# Patient Record
Sex: Male | Born: 1940 | Race: White | Hispanic: No | Smoking: Former smoker
Health system: Southern US, Community
[De-identification: ages and names within clinical notes are randomized; demographics above are authoritative.]

## PROBLEM LIST (undated history)

## (undated) DIAGNOSIS — G4730 Sleep apnea, unspecified: Secondary | ICD-10-CM

## (undated) DIAGNOSIS — K219 Gastro-esophageal reflux disease without esophagitis: Secondary | ICD-10-CM

## (undated) DIAGNOSIS — G5603 Carpal tunnel syndrome, bilateral upper limbs: Secondary | ICD-10-CM

## (undated) DIAGNOSIS — I209 Angina pectoris, unspecified: Secondary | ICD-10-CM

## (undated) DIAGNOSIS — T4145XA Adverse effect of unspecified anesthetic, initial encounter: Secondary | ICD-10-CM

## (undated) DIAGNOSIS — J302 Other seasonal allergic rhinitis: Secondary | ICD-10-CM

## (undated) DIAGNOSIS — T8859XA Other complications of anesthesia, initial encounter: Secondary | ICD-10-CM

## (undated) DIAGNOSIS — D649 Anemia, unspecified: Secondary | ICD-10-CM

## (undated) DIAGNOSIS — R569 Unspecified convulsions: Secondary | ICD-10-CM

## (undated) DIAGNOSIS — R011 Cardiac murmur, unspecified: Secondary | ICD-10-CM

## (undated) DIAGNOSIS — M1990 Unspecified osteoarthritis, unspecified site: Secondary | ICD-10-CM

## (undated) DIAGNOSIS — I2510 Atherosclerotic heart disease of native coronary artery without angina pectoris: Secondary | ICD-10-CM

## (undated) DIAGNOSIS — G629 Polyneuropathy, unspecified: Secondary | ICD-10-CM

## (undated) DIAGNOSIS — I10 Essential (primary) hypertension: Secondary | ICD-10-CM

## (undated) DIAGNOSIS — R3915 Urgency of urination: Secondary | ICD-10-CM

## (undated) HISTORY — PX: CARDIAC CATHETERIZATION: SHX172

## (undated) HISTORY — PX: JOINT REPLACEMENT: SHX530

## (undated) HISTORY — PX: BACK SURGERY: SHX140

## (undated) HISTORY — PX: TONSILLECTOMY: SUR1361

## (undated) HISTORY — PX: APPENDECTOMY: SHX54

---

## 2005-01-02 ENCOUNTER — Inpatient Hospital Stay (HOSPITAL_COMMUNITY): Admission: RE | Admit: 2005-01-02 | Discharge: 2005-01-06 | Payer: Self-pay | Admitting: Neurological Surgery

## 2005-01-28 ENCOUNTER — Encounter: Admission: RE | Admit: 2005-01-28 | Discharge: 2005-01-28 | Payer: Self-pay | Admitting: Neurological Surgery

## 2012-06-17 ENCOUNTER — Other Ambulatory Visit: Payer: Self-pay | Admitting: Neurological Surgery

## 2012-06-17 DIAGNOSIS — M48061 Spinal stenosis, lumbar region without neurogenic claudication: Secondary | ICD-10-CM

## 2012-06-17 DIAGNOSIS — M545 Low back pain: Secondary | ICD-10-CM

## 2012-06-19 ENCOUNTER — Ambulatory Visit
Admission: RE | Admit: 2012-06-19 | Discharge: 2012-06-19 | Disposition: A | Discharge status: Home / Self Care | Payer: Medicare Other | Source: Ambulatory Visit | Attending: Neurological Surgery | Admitting: Neurological Surgery

## 2012-06-19 DIAGNOSIS — M48061 Spinal stenosis, lumbar region without neurogenic claudication: Secondary | ICD-10-CM

## 2012-06-19 DIAGNOSIS — M545 Low back pain: Secondary | ICD-10-CM

## 2012-07-20 ENCOUNTER — Other Ambulatory Visit: Payer: Self-pay | Admitting: Neurological Surgery

## 2012-07-21 ENCOUNTER — Other Ambulatory Visit (HOSPITAL_COMMUNITY): Payer: Self-pay | Admitting: Neurological Surgery

## 2012-07-21 ENCOUNTER — Other Ambulatory Visit: Payer: Self-pay | Admitting: Neurological Surgery

## 2012-07-21 ENCOUNTER — Telehealth (HOSPITAL_COMMUNITY): Payer: Self-pay

## 2012-07-21 DIAGNOSIS — M4802 Spinal stenosis, cervical region: Secondary | ICD-10-CM

## 2012-07-21 NOTE — Telephone Encounter (Signed)
I spoke with Mr. Broner and advised him of his apt.

## 2012-07-23 ENCOUNTER — Ambulatory Visit (HOSPITAL_COMMUNITY)
Admission: RE | Admit: 2012-07-23 | Discharge: 2012-07-23 | Disposition: A | Discharge status: Home / Self Care | Payer: Medicare Other | Source: Ambulatory Visit | Attending: Neurological Surgery | Admitting: Neurological Surgery

## 2012-07-23 ENCOUNTER — Encounter (HOSPITAL_COMMUNITY): Payer: Self-pay | Admitting: Pharmacist

## 2012-07-23 DIAGNOSIS — M4802 Spinal stenosis, cervical region: Secondary | ICD-10-CM

## 2012-07-29 ENCOUNTER — Encounter (HOSPITAL_COMMUNITY)
Admission: RE | Admit: 2012-07-29 | Discharge: 2012-07-29 | Disposition: A | Discharge status: Home / Self Care | Payer: Medicare Other | Source: Ambulatory Visit | Attending: Neurological Surgery | Admitting: Neurological Surgery

## 2012-07-29 ENCOUNTER — Encounter (HOSPITAL_COMMUNITY): Payer: Self-pay

## 2012-07-29 HISTORY — DX: Unspecified convulsions: R56.9

## 2012-07-29 HISTORY — DX: Other seasonal allergic rhinitis: J30.2

## 2012-07-29 HISTORY — DX: Adverse effect of unspecified anesthetic, initial encounter: T41.45XA

## 2012-07-29 HISTORY — DX: Angina pectoris, unspecified: I20.9

## 2012-07-29 HISTORY — DX: Essential (primary) hypertension: I10

## 2012-07-29 HISTORY — DX: Anemia, unspecified: D64.9

## 2012-07-29 HISTORY — DX: Urgency of urination: R39.15

## 2012-07-29 HISTORY — DX: Other complications of anesthesia, initial encounter: T88.59XA

## 2012-07-29 HISTORY — DX: Gastro-esophageal reflux disease without esophagitis: K21.9

## 2012-07-29 HISTORY — DX: Cardiac murmur, unspecified: R01.1

## 2012-07-29 HISTORY — DX: Carpal tunnel syndrome, bilateral upper limbs: G56.03

## 2012-07-29 HISTORY — DX: Unspecified osteoarthritis, unspecified site: M19.90

## 2012-07-29 HISTORY — DX: Atherosclerotic heart disease of native coronary artery without angina pectoris: I25.10

## 2012-07-29 HISTORY — DX: Polyneuropathy, unspecified: G62.9

## 2012-07-29 HISTORY — DX: Sleep apnea, unspecified: G47.30

## 2012-07-29 LAB — PROTIME-INR
INR: 1.08 (ref 0.00–1.49)
Prothrombin Time: 13.9 seconds (ref 11.6–15.2)

## 2012-07-29 LAB — BASIC METABOLIC PANEL
Chloride: 99 mEq/L (ref 96–112)
Creatinine, Ser: 0.64 mg/dL (ref 0.50–1.35)
GFR calc Af Amer: >90 mL/min (ref >90)
GFR calc non Af Amer: >90 mL/min (ref >90)
Potassium: 4.2 mEq/L (ref 3.5–5.1)

## 2012-07-29 LAB — CBC WITH DIFFERENTIAL/PLATELET
Basophils Absolute: 0 10*3/uL (ref 0.0–0.1)
Basophils Relative: 1 % (ref 0–1)
HCT: 37.7 % — ABNORMAL LOW (ref 39.0–52.0)
Lymphocytes Relative: 31 % (ref 12–46)
MCHC: 34 g/dL (ref 30.0–36.0)
Monocytes Absolute: 0.9 10*3/uL (ref 0.1–1.0)
Neutro Abs: 3 10*3/uL (ref 1.7–7.7)
Neutrophils Relative %: 48 % (ref 43–77)
RDW: 13.3 % (ref 11.5–15.5)
WBC: 6.3 10*3/uL (ref 4.0–10.5)

## 2012-07-29 LAB — TYPE AND SCREEN
ABO/RH(D): A POS
Antibody Screen: NEGATIVE

## 2012-07-29 LAB — ABO/RH: ABO/RH(D): A POS

## 2012-07-29 NOTE — Pre-Procedure Instructions (Signed)
20 Wayne Moore  07/29/2012   Your procedure is scheduled on:  08/05/12  Report to [facility] at 10:30 AM.  Call this number if you have problems the morning of surgery: [phone]   Remember:    Do not eat food or drink any liquids:After Midnight.   Take these medicines the morning of surgery with A SIP OF WATER: Gabapentin/Neurontin, Metoprolol/Lopressor, Prilosec/Omeprazole   Do not wear jewelry, make-up or nail polish.  Do not wear lotions, powders, or perfumes. You may wear deodorant.  Do not shave 48 hours prior to surgery. Men may shave face and neck.  Do not bring valuables to the hospital.  Contacts, dentures or bridgework may not be worn into surgery.  Leave suitcase in the car. After surgery it may be brought to your room.  For patients admitted to the hospital, checkout time is 11:00 AM the day of discharge.   Patients discharged the day of surgery will not be allowed to drive home.    Special Instructions: CHG Shower Use Special Wash: 1/2 bottle night before surgery and 1/2 bottle morning of surgery.   Please read over the following fact sheets that you were given: Pain Booklet, Coughing and Deep Breathing, Blood Transfusion Information, MRSA Information and Surgical Site Infection Prevention

## 2012-07-29 NOTE — Progress Notes (Addendum)
Wayne Moore to request Sleep Study done 2 yrs ago from [facility] sleep Center and stress test done 2 yrs ago at [facility]. States this Cardiologist released him to primary MD Wayne Moore at [facility]- seen last week.  Also req EKG from primary MD -done last week and CXR done 03/2012 at [facility]  Patient uses Nasal Cannula only with CPAP machine.    Per Wayne Moore at [facility] Records- Sleep Study was done 2006 and those records are not stored on site.

## 2012-07-30 NOTE — Progress Notes (Signed)
Called and requested stress test from [facility]

## 2012-07-30 NOTE — Progress Notes (Signed)
Requested stress,ekg,office notes from [facility].

## 2012-08-03 NOTE — Progress Notes (Signed)
Call placed again to [facility] at [facility] and re-requested stress test.

## 2012-08-04 ENCOUNTER — Other Ambulatory Visit (HOSPITAL_COMMUNITY): Payer: Self-pay | Admitting: Neurological Surgery

## 2012-08-04 ENCOUNTER — Ambulatory Visit (HOSPITAL_COMMUNITY)
Admission: RE | Admit: 2012-08-04 | Discharge: 2012-08-04 | Disposition: A | Discharge status: Home / Self Care | Payer: Medicare Other | Source: Ambulatory Visit | Attending: Neurological Surgery | Admitting: Neurological Surgery

## 2012-08-04 ENCOUNTER — Inpatient Hospital Stay (HOSPITAL_COMMUNITY)
Admission: RE | Admit: 2012-08-04 | Discharge: 2012-08-08 | DRG: 460 | Disposition: A | Discharge status: Home / Self Care | Payer: Medicare Other | Source: Ambulatory Visit | Attending: Neurological Surgery | Admitting: Neurological Surgery

## 2012-08-04 DIAGNOSIS — M4802 Spinal stenosis, cervical region: Secondary | ICD-10-CM

## 2012-08-04 DIAGNOSIS — Z452 Encounter for adjustment and management of vascular access device: Secondary | ICD-10-CM | POA: Insufficient documentation

## 2012-08-04 DIAGNOSIS — I2510 Atherosclerotic heart disease of native coronary artery without angina pectoris: Secondary | ICD-10-CM | POA: Diagnosis present

## 2012-08-04 DIAGNOSIS — Z01812 Encounter for preprocedural laboratory examination: Secondary | ICD-10-CM

## 2012-08-04 DIAGNOSIS — M47817 Spondylosis without myelopathy or radiculopathy, lumbosacral region: Principal | ICD-10-CM | POA: Diagnosis present

## 2012-08-04 DIAGNOSIS — Z7982 Long term (current) use of aspirin: Secondary | ICD-10-CM

## 2012-08-04 DIAGNOSIS — I10 Essential (primary) hypertension: Secondary | ICD-10-CM | POA: Diagnosis present

## 2012-08-04 MED ORDER — DEXTROSE 5 % IV SOLN
3.0000 g | INTRAVENOUS | Status: AC
  Administered 2012-08-05: 3 g via INTRAVENOUS
  Filled 2012-08-04: qty 3000

## 2012-08-04 MED ORDER — DEXAMETHASONE SODIUM PHOSPHATE 10 MG/ML IJ SOLN
10.0000 mg | INTRAMUSCULAR | Status: AC
  Administered 2012-08-05: 10 mg via INTRAVENOUS
  Filled 2012-08-04: qty 1

## 2012-08-04 NOTE — Progress Notes (Addendum)
Fax received from [facility]. Copy of stress test(06/02/2008) showed normal ejection fraction-57% per Dr. Fayrene Fearing McGurkin. Due to having abnormal EKG on 9//07/2012- giving chart to A. Zelenak, Anesthesia PA, to review. //L. Melenda Bielak,RN

## 2012-08-04 NOTE — Procedures (Signed)
Successful RUE SL POWER PICC TIP SVC NO COMP STABLE

## 2012-08-04 NOTE — Consult Note (Signed)
Anesthesia Chart Review:  Patient is a 71 year old male scheduled for L2-3 PLIF, removal of hardware L3-S1, laminectomy L1-2 on 08/05/12 by Dr. Yetta Barre.  His PAT visit was on 07/29/12.  I was given the chart to review today following receipt of the requested information by his PAT RN.  History includes CAD s/p RCA stent '02 ([facility]), obesity, HTN, OSA with CPAP use (last sleep study '06 @ [facility] in storage), GERD, acute blood loss anemia secondary to GI bleed '92, "seizure" associated with hypotension (sounded like a vasovagal reaction), childhood murmur, prior back and left TKA surgeries.  He reports he has had hypotension related to anesthesia.Marland Kitchen  He underwent RUE SL Power PICC in IR earlier today because he was told he has poor IV access.  [facility] is Internist Dr. Sherrie Mustache.  His local PCP is Dr. Carollee Massed.  EKG on 07/20/12 showed SR, marked LAD, incomplete right BBB.  Dr. Lyn Hollingshead did not think it was significantly changed since 07/17/11.  Overall, I think it is stable since 05/27/08.  He was previously seen by Cardiologist Dr. Bonnielee Haff at [facility].  Patient had had a nuclear stress test on 06/02/08 that showed no evidence of ischemia, reversed redistribution of the inferior wall which is of unknown clinical significance, normal EF 57% and wall motion.  He last saw Dr. Wille Glaser on 12/01/08.  He recommended PRN follow-up with follow-up stress testing at his PCP's discretion to periodically reassess his cardiac risks.   CXR on 04/10/12 at the [facility] in [place] showed chest cardiomegaly, lungs clear, bony thorax unremarkable.  Labs noted.    I called and spoke with Mr. Kall.  He denies chest pain and shortness of breath at rest. He has chronic dyspnea on exertion with moderate activity. He has mild LE edema with prolonged standing.  This is not new, and he hasn't required long term diuretic therapy.  His activity is somewhat limited due to his  back issues, but he was able to walk around [employer] two times today without CV symptoms.  I reviewed above with Anesthesiologist Dr. Randa Evens who recommended I touch base with his PCP.  I called and spoke with Dr. Lyn Hollingshead who confirmed that he is aware of the planned procedure.  Dr. Lyn Hollingshead does not recommend any additional pre-operative testing since patient is asymptomatic and had a normal stress test 4 years ago.  Anticipate Mr. Rickel can proceed as planned if no new CV symptoms.  Shonna Chock, PA-C 08/04/12 1323

## 2012-08-05 ENCOUNTER — Encounter (HOSPITAL_COMMUNITY)
Admission: RE | Disposition: A | Discharge status: Home / Self Care | Payer: Self-pay | Source: Ambulatory Visit | Attending: Neurological Surgery

## 2012-08-05 ENCOUNTER — Encounter (HOSPITAL_COMMUNITY): Payer: Self-pay | Admitting: Vascular Surgery

## 2012-08-05 ENCOUNTER — Encounter (HOSPITAL_COMMUNITY): Payer: Self-pay | Admitting: Neurological Surgery

## 2012-08-05 ENCOUNTER — Encounter (HOSPITAL_COMMUNITY): Payer: Self-pay | Admitting: *Deleted

## 2012-08-05 ENCOUNTER — Inpatient Hospital Stay (HOSPITAL_COMMUNITY): Payer: Medicare Other

## 2012-08-05 ENCOUNTER — Inpatient Hospital Stay (HOSPITAL_COMMUNITY): Payer: Medicare Other | Admitting: Vascular Surgery

## 2012-08-05 SURGERY — POSTERIOR LUMBAR FUSION 1 WITH HARDWARE REMOVAL
Anesthesia: General | Site: Back | Laterality: Bilateral | Wound class: Clean

## 2012-08-05 MED ORDER — PHENOL 1.4 % MT LIQD: 1.0000 | OROMUCOSAL | Status: DC | PRN

## 2012-08-05 MED ORDER — SODIUM CHLORIDE 0.9 % IV SOLN
INTRAVENOUS | Status: DC | PRN
  Administered 2012-08-05: 17:00:00 via INTRAVENOUS

## 2012-08-05 MED ORDER — SODIUM CHLORIDE 0.9 % IR SOLN
Status: DC | PRN
  Administered 2012-08-05: 14:00:00

## 2012-08-05 MED ORDER — SODIUM CHLORIDE 0.9 % IV SOLN: 250.0000 mL | INTRAVENOUS | Status: DC

## 2012-08-05 MED ORDER — PHENYLEPHRINE HCL 10 MG/ML IJ SOLN
INTRAMUSCULAR | Status: DC | PRN
  Administered 2012-08-05 (×2): 40 ug via INTRAVENOUS
  Administered 2012-08-05: 80 ug via INTRAVENOUS

## 2012-08-05 MED ORDER — TERAZOSIN HCL 5 MG PO CAPS
5.0000 mg | ORAL_CAPSULE | Freq: Every day | ORAL | Status: DC
  Administered 2012-08-05 – 2012-08-07 (×3): 5 mg via ORAL
  Filled 2012-08-05 (×4): qty 1

## 2012-08-05 MED ORDER — LACTATED RINGERS IV SOLN
INTRAVENOUS | Status: DC | PRN
  Administered 2012-08-05 (×2): via INTRAVENOUS

## 2012-08-05 MED ORDER — BISACODYL 10 MG RE SUPP: 10.0000 mg | Freq: Every day | RECTAL | Status: DC | PRN

## 2012-08-05 MED ORDER — GABAPENTIN 300 MG PO CAPS
300.0000 mg | ORAL_CAPSULE | Freq: Three times a day (TID) | ORAL | Status: DC
  Administered 2012-08-05 – 2012-08-07 (×7): 300 mg via ORAL
  Filled 2012-08-05 (×10): qty 1

## 2012-08-05 MED ORDER — SENNA 8.6 MG PO TABS
1.0000 | ORAL_TABLET | Freq: Two times a day (BID) | ORAL | Status: DC
  Administered 2012-08-05 – 2012-08-07 (×5): 8.6 mg via ORAL
  Filled 2012-08-05 (×6): qty 1

## 2012-08-05 MED ORDER — THROMBIN 20000 UNITS EX KIT
PACK | CUTANEOUS | Status: DC | PRN
  Administered 2012-08-05: 20000 [IU] via TOPICAL

## 2012-08-05 MED ORDER — HEMOSTATIC AGENTS (NO CHARGE) OPTIME
TOPICAL | Status: DC | PRN
  Administered 2012-08-05: 1 via TOPICAL

## 2012-08-05 MED ORDER — ALBUMIN HUMAN 5 % IV SOLN
INTRAVENOUS | Status: DC | PRN
  Administered 2012-08-05: 16:00:00 via INTRAVENOUS

## 2012-08-05 MED ORDER — ZOLPIDEM TARTRATE 5 MG PO TABS: 5.0000 mg | ORAL_TABLET | Freq: Every evening | ORAL | Status: DC | PRN

## 2012-08-05 MED ORDER — NEOSTIGMINE METHYLSULFATE 1 MG/ML IJ SOLN
INTRAMUSCULAR | Status: DC | PRN
  Administered 2012-08-05: 5 mg via INTRAVENOUS

## 2012-08-05 MED ORDER — CEFAZOLIN SODIUM 1-5 GM-% IV SOLN
1.0000 g | Freq: Three times a day (TID) | INTRAVENOUS | Status: AC
  Administered 2012-08-05 – 2012-08-06 (×2): 1 g via INTRAVENOUS
  Filled 2012-08-05 (×2): qty 50

## 2012-08-05 MED ORDER — METHOCARBAMOL 100 MG/ML IJ SOLN
500.0000 mg | Freq: Four times a day (QID) | INTRAVENOUS | Status: DC | PRN
  Filled 2012-08-05: qty 5

## 2012-08-05 MED ORDER — ASPIRIN EC 81 MG PO TBEC
81.0000 mg | DELAYED_RELEASE_TABLET | Freq: Every day | ORAL | Status: DC
  Administered 2012-08-05 – 2012-08-07 (×3): 81 mg via ORAL
  Filled 2012-08-05 (×4): qty 1

## 2012-08-05 MED ORDER — POTASSIUM CHLORIDE IN NACL 20-0.9 MEQ/L-% IV SOLN
INTRAVENOUS | Status: DC
  Administered 2012-08-05: 22:00:00 via INTRAVENOUS
  Filled 2012-08-05 (×6): qty 1000

## 2012-08-05 MED ORDER — DEXAMETHASONE SODIUM PHOSPHATE 4 MG/ML IJ SOLN
4.0000 mg | Freq: Four times a day (QID) | INTRAMUSCULAR | Status: DC
  Filled 2012-08-05 (×12): qty 1

## 2012-08-05 MED ORDER — GLYCOPYRROLATE 0.2 MG/ML IJ SOLN
INTRAMUSCULAR | Status: DC | PRN
  Administered 2012-08-05: .8 mg via INTRAVENOUS

## 2012-08-05 MED ORDER — ONDANSETRON HCL 4 MG/2ML IJ SOLN: 4.0000 mg | INTRAMUSCULAR | Status: DC | PRN

## 2012-08-05 MED ORDER — HYDROMORPHONE HCL PF 1 MG/ML IJ SOLN
0.2500 mg | INTRAMUSCULAR | Status: DC | PRN
  Administered 2012-08-05 (×2): 0.5 mg via INTRAVENOUS

## 2012-08-05 MED ORDER — SODIUM CHLORIDE 0.9 % IJ SOLN: 3.0000 mL | Freq: Two times a day (BID) | INTRAMUSCULAR | Status: DC

## 2012-08-05 MED ORDER — OXYCODONE HCL 5 MG PO TABS: 5.0000 mg | ORAL_TABLET | ORAL | Status: DC | PRN

## 2012-08-05 MED ORDER — ACETAMINOPHEN 10 MG/ML IV SOLN
1000.0000 mg | Freq: Four times a day (QID) | INTRAVENOUS | Status: AC
  Administered 2012-08-05 – 2012-08-06 (×3): 1000 mg via INTRAVENOUS
  Filled 2012-08-05 (×4): qty 100

## 2012-08-05 MED ORDER — FENTANYL CITRATE 0.05 MG/ML IJ SOLN
INTRAMUSCULAR | Status: DC | PRN
  Administered 2012-08-05 (×2): 50 ug via INTRAVENOUS
  Administered 2012-08-05: 100 ug via INTRAVENOUS

## 2012-08-05 MED ORDER — SODIUM CHLORIDE 0.9 % IJ SOLN: 3.0000 mL | INTRAMUSCULAR | Status: DC | PRN

## 2012-08-05 MED ORDER — BACLOFEN 5 MG HALF TABLET
5.0000 mg | ORAL_TABLET | Freq: Every day | ORAL | Status: DC
  Administered 2012-08-05 – 2012-08-07 (×3): 5 mg via ORAL
  Filled 2012-08-05 (×4): qty 1

## 2012-08-05 MED ORDER — BUPIVACAINE HCL (PF) 0.25 % IJ SOLN
INTRAMUSCULAR | Status: DC | PRN
  Administered 2012-08-05: 1 mL

## 2012-08-05 MED ORDER — ONDANSETRON HCL 4 MG/2ML IJ SOLN: 4.0000 mg | Freq: Once | INTRAMUSCULAR | Status: DC | PRN

## 2012-08-05 MED ORDER — ACETAMINOPHEN 10 MG/ML IV SOLN: 1000.0000 mg | Freq: Once | INTRAVENOUS | Status: DC | PRN

## 2012-08-05 MED ORDER — VECURONIUM BROMIDE 10 MG IV SOLR
INTRAVENOUS | Status: DC | PRN
  Administered 2012-08-05: 2 mg via INTRAVENOUS
  Administered 2012-08-05: 1 mg via INTRAVENOUS
  Administered 2012-08-05: 3 mg via INTRAVENOUS

## 2012-08-05 MED ORDER — MIDAZOLAM HCL 5 MG/5ML IJ SOLN
INTRAMUSCULAR | Status: DC | PRN
  Administered 2012-08-05 (×2): 1 mg via INTRAVENOUS

## 2012-08-05 MED ORDER — 0.9 % SODIUM CHLORIDE (POUR BTL) OPTIME
TOPICAL | Status: DC | PRN
  Administered 2012-08-05: 1000 mL

## 2012-08-05 MED ORDER — METOPROLOL TARTRATE 50 MG PO TABS
50.0000 mg | ORAL_TABLET | Freq: Two times a day (BID) | ORAL | Status: DC
  Administered 2012-08-05 – 2012-08-07 (×5): 50 mg via ORAL
  Filled 2012-08-05 (×7): qty 1

## 2012-08-05 MED ORDER — ROCURONIUM BROMIDE 100 MG/10ML IV SOLN
INTRAVENOUS | Status: DC | PRN
  Administered 2012-08-05: 50 mg via INTRAVENOUS

## 2012-08-05 MED ORDER — LISINOPRIL 10 MG PO TABS
10.0000 mg | ORAL_TABLET | Freq: Two times a day (BID) | ORAL | Status: DC
  Administered 2012-08-05 – 2012-08-07 (×5): 10 mg via ORAL
  Filled 2012-08-05 (×7): qty 1

## 2012-08-05 MED ORDER — OXYCODONE-ACETAMINOPHEN 5-325 MG PO TABS: 1.0000 | ORAL_TABLET | ORAL | Status: DC | PRN

## 2012-08-05 MED ORDER — ONDANSETRON HCL 4 MG/2ML IJ SOLN
INTRAMUSCULAR | Status: DC | PRN
  Administered 2012-08-05: 4 mg via INTRAVENOUS

## 2012-08-05 MED ORDER — FESOTERODINE FUMARATE ER 8 MG PO TB24
8.0000 mg | ORAL_TABLET | Freq: Every day | ORAL | Status: DC
  Administered 2012-08-05 – 2012-08-07 (×3): 8 mg via ORAL
  Filled 2012-08-05 (×7): qty 1

## 2012-08-05 MED ORDER — MENTHOL 3 MG MT LOZG
1.0000 | LOZENGE | OROMUCOSAL | Status: DC | PRN
  Filled 2012-08-05: qty 9

## 2012-08-05 MED ORDER — BACITRACIN 50000 UNITS IM SOLR
INTRAMUSCULAR | Status: AC
  Filled 2012-08-05: qty 1

## 2012-08-05 MED ORDER — HEPARIN SODIUM (PORCINE) 1000 UNIT/ML IJ SOLN
INTRAMUSCULAR | Status: AC
  Filled 2012-08-05: qty 1

## 2012-08-05 MED ORDER — SODIUM CHLORIDE 0.9 % IV SOLN
INTRAVENOUS | Status: AC
  Filled 2012-08-05: qty 500

## 2012-08-05 MED ORDER — HYDROMORPHONE HCL PF 1 MG/ML IJ SOLN
INTRAMUSCULAR | Status: AC
  Administered 2012-08-05: 0.5 mg via INTRAVENOUS
  Filled 2012-08-05: qty 1

## 2012-08-05 MED ORDER — ACETAMINOPHEN 10 MG/ML IV SOLN
INTRAVENOUS | Status: AC
  Filled 2012-08-05: qty 100

## 2012-08-05 MED ORDER — PROPOFOL 10 MG/ML IV BOLUS
INTRAVENOUS | Status: DC | PRN
  Administered 2012-08-05: 150 mg via INTRAVENOUS

## 2012-08-05 MED ORDER — ACETAMINOPHEN 650 MG RE SUPP: 650.0000 mg | RECTAL | Status: DC | PRN

## 2012-08-05 MED ORDER — EPHEDRINE SULFATE 50 MG/ML IJ SOLN
INTRAMUSCULAR | Status: DC | PRN
  Administered 2012-08-05 (×5): 10 mg via INTRAVENOUS

## 2012-08-05 MED ORDER — DEXAMETHASONE 4 MG PO TABS
4.0000 mg | ORAL_TABLET | Freq: Four times a day (QID) | ORAL | Status: DC
Start: 2012-08-05 — End: 2012-08-08
  Administered 2012-08-05 – 2012-08-08 (×9): 4 mg via ORAL
  Filled 2012-08-05 (×14): qty 1

## 2012-08-05 MED ORDER — MORPHINE SULFATE 2 MG/ML IJ SOLN: 1.0000 mg | INTRAMUSCULAR | Status: DC | PRN

## 2012-08-05 MED ORDER — ACETAMINOPHEN 325 MG PO TABS
650.0000 mg | ORAL_TABLET | ORAL | Status: DC | PRN
  Administered 2012-08-08: 650 mg via ORAL
  Filled 2012-08-05: qty 2

## 2012-08-05 MED ORDER — SUCCINYLCHOLINE CHLORIDE 20 MG/ML IJ SOLN
INTRAMUSCULAR | Status: DC | PRN
  Administered 2012-08-05: 100 mg via INTRAVENOUS

## 2012-08-05 MED ORDER — METHOCARBAMOL 500 MG PO TABS: 500.0000 mg | ORAL_TABLET | Freq: Four times a day (QID) | ORAL | Status: DC | PRN

## 2012-08-05 SURGICAL SUPPLY — 65 items
APL SKNCLS STERI-STRIP NONHPOA (GAUZE/BANDAGES/DRESSINGS) ×1
BAG DECANTER FOR FLEXI CONT (MISCELLANEOUS) ×2 IMPLANT
BENZOIN TINCTURE PRP APPL 2/3 (GAUZE/BANDAGES/DRESSINGS) ×2 IMPLANT
BLADE SURG ROTATE 9660 (MISCELLANEOUS) IMPLANT
BUR MATCHSTICK NEURO 3.0 LAGG (BURR) ×2 IMPLANT
CAGE CAPSTONE TELAMON 8X26 (Cage) ×1 IMPLANT
CANISTER SUCTION 2500CC (MISCELLANEOUS) ×2 IMPLANT
CLOTH BEACON ORANGE TIMEOUT ST (SAFETY) ×2 IMPLANT
CONT SPEC 4OZ CLIKSEAL STRL BL (MISCELLANEOUS) ×4 IMPLANT
COVER BACK TABLE 24X17X13 BIG (DRAPES) IMPLANT
COVER TABLE BACK 60X90 (DRAPES) ×2 IMPLANT
DRAPE C-ARM 42X72 X-RAY (DRAPES) ×4 IMPLANT
DRAPE LAPAROTOMY 100X72X124 (DRAPES) ×2 IMPLANT
DRAPE POUCH INSTRU U-SHP 10X18 (DRAPES) ×2 IMPLANT
DRAPE SURG 17X23 STRL (DRAPES) ×2 IMPLANT
DRESSING TELFA 8X3 (GAUZE/BANDAGES/DRESSINGS) ×2 IMPLANT
DRSG OPSITE 4X5.5 SM (GAUZE/BANDAGES/DRESSINGS) ×3 IMPLANT
DURAPREP 26ML APPLICATOR (WOUND CARE) ×2 IMPLANT
ELECT REM PT RETURN 9FT ADLT (ELECTROSURGICAL) ×2
ELECTRODE REM PT RTRN 9FT ADLT (ELECTROSURGICAL) ×1 IMPLANT
EVACUATOR 1/8 PVC DRAIN (DRAIN) ×2 IMPLANT
GAUZE SPONGE 4X4 16PLY XRAY LF (GAUZE/BANDAGES/DRESSINGS) IMPLANT
GLOVE BIO SURGEON STRL SZ8 (GLOVE) ×4 IMPLANT
GLOVE BIO SURGEON STRL SZ8.5 (GLOVE) ×1 IMPLANT
GLOVE BIOGEL PI IND STRL 7.5 (GLOVE) IMPLANT
GLOVE BIOGEL PI IND STRL 8.5 (GLOVE) IMPLANT
GLOVE BIOGEL PI INDICATOR 7.5 (GLOVE) ×2
GLOVE BIOGEL PI INDICATOR 8.5 (GLOVE) ×1
GLOVE ECLIPSE 7.5 STRL STRAW (GLOVE) ×3 IMPLANT
GLOVE SS BIOGEL STRL SZ 8 (GLOVE) IMPLANT
GLOVE SUPERSENSE BIOGEL SZ 8 (GLOVE) ×1
GLOVE SURG SS PI 8.0 STRL IVOR (GLOVE) ×3 IMPLANT
GOWN BRE IMP SLV AUR LG STRL (GOWN DISPOSABLE) IMPLANT
GOWN BRE IMP SLV AUR XL STRL (GOWN DISPOSABLE) ×7 IMPLANT
GOWN STRL REIN 2XL LVL4 (GOWN DISPOSABLE) ×1 IMPLANT
HEMOSTAT POWDER KIT SURGIFOAM (HEMOSTASIS) IMPLANT
KIT BASIN OR (CUSTOM PROCEDURE TRAY) ×2 IMPLANT
KIT ROOM TURNOVER OR (KITS) ×2 IMPLANT
MILL MEDIUM DISP (BLADE) ×1 IMPLANT
NDL HYPO 25X1 1.5 SAFETY (NEEDLE) ×1 IMPLANT
NEEDLE HYPO 22GX1.5 SAFETY (NEEDLE) ×1 IMPLANT
NEEDLE HYPO 25X1 1.5 SAFETY (NEEDLE) ×2 IMPLANT
NS IRRIG 1000ML POUR BTL (IV SOLUTION) ×2 IMPLANT
PACK FOAM VITOSS 10CC (Orthopedic Implant) ×1 IMPLANT
PACK LAMINECTOMY NEURO (CUSTOM PROCEDURE TRAY) ×2 IMPLANT
PAD ARMBOARD 7.5X6 YLW CONV (MISCELLANEOUS) ×6 IMPLANT
ROD DANEK 40MM (Rod) ×1 IMPLANT
ROD PREBENT 6.35X50 (Rod) ×1 IMPLANT
SCREW PEDICLE VA L635 6.5X50M (Screw) ×2 IMPLANT
SCREW SET NON BREAK OFF (Screw) ×4 IMPLANT
SPONGE LAP 4X18 X RAY DECT (DISPOSABLE) IMPLANT
SPONGE SURGIFOAM ABS GEL 100 (HEMOSTASIS) ×2 IMPLANT
STRIP CLOSURE SKIN 1/2X4 (GAUZE/BANDAGES/DRESSINGS) ×3 IMPLANT
SUT VIC AB 0 CT1 18XCR BRD8 (SUTURE) ×1 IMPLANT
SUT VIC AB 0 CT1 8-18 (SUTURE) ×4
SUT VIC AB 2-0 CP2 18 (SUTURE) ×3 IMPLANT
SUT VIC AB 3-0 SH 8-18 (SUTURE) ×4 IMPLANT
SYR 20ML ECCENTRIC (SYRINGE) ×2 IMPLANT
SYR CONTROL 10ML LL (SYRINGE) ×1 IMPLANT
TOWEL OR 17X24 6PK STRL BLUE (TOWEL DISPOSABLE) ×2 IMPLANT
TOWEL OR 17X26 10 PK STRL BLUE (TOWEL DISPOSABLE) ×2 IMPLANT
TRAP SPECIMEN MUCOUS 40CC (MISCELLANEOUS) IMPLANT
TRAY FOLEY CATH 14FRSI W/METER (CATHETERS) ×2 IMPLANT
WATER STERILE IRR 1000ML POUR (IV SOLUTION) ×2 IMPLANT
WEDGE TANGENT 8X26MM ×1 IMPLANT

## 2012-08-05 NOTE — Transfer of Care (Signed)
Immediate Anesthesia Transfer of Care Note  Patient: Wayne Moore  Procedure(s) Performed: Procedure(s) (LRB) with comments: POSTERIOR LUMBAR FUSION 1 WITH HARDWARE REMOVAL (Bilateral) - Lumbar two-three posterior lumbar interbody fusion with pedicle screws, removal of hardware lumbar three-sacral one, laminectomy lumbar one-two  Patient Location: PACU  Anesthesia Type: General  Level of Consciousness: sedated  Airway & Oxygen Therapy: Patient Spontanous Breathing and Patient connected to face mask oxygen  Post-op Assessment: Report given to PACU RN and Post -op Vital signs reviewed and stable  Post vital signs: Reviewed and stable  Complications: No apparent anesthesia complications

## 2012-08-05 NOTE — Progress Notes (Signed)
Orthopedic Tech Progress Note Patient Details:  Wayne Moore 08-May-1941 [id number]  Patient ID: Wayne Moore, male   DOB: 07-12-1941, 71 y.o.   MRN: [id number]   Wayne Moore 08/05/2012, 8:45 AM LS SUPPORT WITH ANT. AND POST PANELS COMPLETED BY BIO TECH

## 2012-08-05 NOTE — Anesthesia Preprocedure Evaluation (Addendum)
Anesthesia Evaluation  Patient identified by MRN, date of birth, ID band Patient awake    Reviewed: Allergy & Precautions, H&P , NPO status , Patient's Chart, lab work & pertinent test results, reviewed documented beta blocker date and time   History of Anesthesia Complications (+) DIFFICULT AIRWAY  Airway Mallampati: III TM Distance: <3 FB     Dental  (+) Edentulous Upper and Partial Lower   Pulmonary sleep apnea and Continuous Positive Airway Pressure Ventilation ,  breath sounds clear to auscultation        Cardiovascular hypertension, Pt. on home beta blockers + CAD + Valvular Problems/Murmurs Rhythm:Regular Rate:Normal     Neuro/Psych Seizures -, Well Controlled,   Neuromuscular disease    GI/Hepatic GERD-  Medicated and Controlled,  Endo/Other    Renal/GU      Musculoskeletal   Abdominal   Peds  Hematology   Anesthesia Other Findings   Reproductive/Obstetrics                        Anesthesia Physical Anesthesia Plan  ASA: III  Anesthesia Plan: General   Post-op Pain Management:    Induction: Intravenous  Airway Management Planned: Oral ETT  Additional Equipment:   Intra-op Plan:   Post-operative Plan: Extubation in OR  Informed Consent: I have reviewed the patients History and Physical, chart, labs and discussed the procedure including the risks, benefits and alternatives for the proposed anesthesia with the patient or authorized representative who has indicated his/her understanding and acceptance.   Dental advisory given  Plan Discussed with: CRNA and Anesthesiologist  Anesthesia Plan Comments:         Anesthesia Quick Evaluation

## 2012-08-05 NOTE — Preoperative (Signed)
Beta Blockers   Reason not to administer Beta Blockers:Not Applicable 

## 2012-08-05 NOTE — Anesthesia Procedure Notes (Signed)
Procedure Name: Intubation Date/Time: 08/05/2012 1:35 PM Performed by: Sharlene Dory E Pre-anesthesia Checklist: Patient identified, Emergency Drugs available, Suction available, Patient being monitored and Timeout performed Patient Re-evaluated:Patient Re-evaluated prior to inductionOxygen Delivery Method: Circle system utilized Preoxygenation: Pre-oxygenation with 100% oxygen Intubation Type: IV induction Ventilation: Mask ventilation without difficulty and Oral airway inserted - appropriate to patient size Laryngoscope Size: Mac and 4 Grade View: Grade III Tube size: 7.5 mm Number of attempts: 1 Airway Equipment and Method: Stylet Placement Confirmation: ETT inserted through vocal cords under direct vision,  positive ETCO2 and breath sounds checked- equal and bilateral Secured at: 21 cm Tube secured with: Tape Dental Injury: Teeth and Oropharynx as per pre-operative assessment

## 2012-08-05 NOTE — Op Note (Signed)
08/05/2012  5:28 PM  PATIENT:  Wayne Moore  71 y.o. 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 Decompressive lumbar laminectomy L1-2 and L2-3 requiring more work than would be required of the typical PLIF procedure in order to adequately decompress the neural elements to address his spinal stenosis.  2. Posterior lumbar interbody fusion L2-3 using a PEEK interbody cage packed with morcellized allograft and autograft and an allograft wedge.  3. Posterior fixation L2-3 using Legacy pedicle screws.  4. Intertransverse arthrodesis L2-3 using morcellized autograft and allograft.  SURGEON:  Marikay Alar, MD  ASSISTANTS: Dr. Lovell Sheehan  ANESTHESIA:  General  EBL: 600 ml  Total I/O In: 2380 [I.V.:1900; Blood:230; IV Piggyback:250] Out: 1375 [Urine:775; Blood:600]  BLOOD ADMINISTERED:none  DRAINS: Hemovac   INDICATION FOR PROCEDURE: This patient is an old patient of mine and underwent previous L3-S1 fusion. He developed back and bilateral leg pain. CT scan showed adjacent level stenosis L1-L2 3 with solid fusions below. He had weakness in his legs. Recommended decompressive laminectomy followed by instrumented fusion. Patient understood the risks, benefits, and alternatives and potential outcomes and wished to proceed.  PROCEDURE DETAILS:  The patient was brought to the operating room. After induction of generalized endotracheal anesthesia the patient was rolled into the prone position on chest rolls and all pressure points were padded. The patient's lumbar region was cleaned and then prepped with DuraPrep and draped in the usual sterile fashion. Anesthesia was injected and then a dorsal midline incision was made and carried down to the lumbosacral fascia. The fascia was opened and the paraspinous musculature was taken down in a subperiosteal fashion to expose L1-2, L2-3,  and the hardware from L3-S1. I exposed the hardware and removed the locking caps from each tulip head. I then removed the cross-link and the rod. The L3 pedicle screws were tight, the L4 pedicle screws were removed. The L5 and S1 pedicle screws were very difficult to remove as I couldn't get the driver into the tulip heads. Therefore these were left in place. Intraoperative fluoroscopy confirmed my level, and the spinous process was removed and complete lumbar laminectomies, hemi- facetectomies, and foraminotomies were performed at L2-3 and L1-2. The yellow ligament was removed to expose the underlying dura and nerve roots, and generous foraminotomies were performed to adequately decompress the neural elements. Once the decompression was complete, I turned my attention to the posterior lower lumbar interbody fusion. The epidural venous vasculature was coagulated and cut sharply. Disc space was incised and the initial discectomy was performed with pituitary rongeurs. The disc space was distracted with sequential distractors to a height of 8 mm. We then used a series of scrapers and shavers to prepare the endplates for fusion. The midline was prepared with Epstein curettes. Once the complete discectomy was finished, we packed an appropriate sized peek interbody cage with local autograft and morcellized allograft, gently retracted the nerve root, and tapped the cage into position at L2-3. We also tapped a tangent interbody bone wedge into the opposite side. The midline was packed with morselized autograft and allograft. We then turned our attention to the posterior fixation. The pedicle screw entry zones were identified utilizing surface landmarks and fluoroscopy. We probed each pedicle with the pedicle probe and tapped each pedicle with the appropriate tap. We palpated with a ball probe to assure no break in the cortex. We then placed 6  5 x 50 mm pedicle screws into the pedicles bilaterally at L2. We then decorticated  the transverse processes and laid a mixture of morcellized autograft and allograft out over these to perform intertransverse arthrodesis at L2-3. We then placed lordotic rods into the multiaxial screw heads of the pedicle screws and locked these in position with the locking caps and anti-torque device. We achieved compression of our grafts. We then checked our construct with AP and lateral fluoroscopy. Irrigated with copious amounts of bacitracin-containing saline solution. Placed a medium Hemovac drain through separate stab incision. Inspected the nerve roots once again to assure adequate decompression, lined to the dura with Gelfoam, and closed the muscle and the fascia with 0 Vicryl. Closed the subcutaneous tissues with 2-0 Vicryl and subcuticular tissues with 3-0 Vicryl. The skin was closed with benzoin and Steri-Strips. Dressing was then applied, the patient was awakened from general anesthesia and transported to the recovery room in stable condition. At the end of the procedure all sponge, needle and instrument counts were correct.   PLAN OF CARE: Admit to inpatient   PATIENT DISPOSITION:  PACU - hemodynamically stable.   Delay start of Pharmacological VTE agent (>24hrs) due to surgical blood loss or risk of bleeding:  yes

## 2012-08-05 NOTE — Anesthesia Postprocedure Evaluation (Signed)
  Anesthesia Post-op Note  Patient: Wayne Moore  Procedure(s) Performed: Procedure(s) (LRB) with comments: POSTERIOR LUMBAR FUSION 1 WITH HARDWARE REMOVAL (Bilateral) - Lumbar two-three posterior lumbar interbody fusion with pedicle screws, removal of hardware lumbar three-sacral one, laminectomy lumbar one-two  Patient Location: PACU  Anesthesia Type: General  Level of Consciousness: awake, alert  and oriented  Airway and Oxygen Therapy: Patient Spontanous Breathing and Patient connected to nasal cannula oxygen  Post-op Pain: mild  Post-op Assessment: Post-op Vital signs reviewed and Patient's Cardiovascular Status Stable  Post-op Vital Signs: stable  Complications: No apparent anesthesia complications

## 2012-08-05 NOTE — H&P (Signed)
Subjective: Patient is a 71 y.o. male admitted for adjacent level stenosis L2-3. Onset of symptoms was several months ago, gradually worsening since that time.  The pain is rated severe, and is located at the across the lower back and radiates to legs left greater than right. The pain is described as aching and occurs intermittently. The symptoms have been progressive. Symptoms are exacerbated by exercise. MRI or CT showed adjacent will stenosis L2-3 and previous fusion L3-S1 which is solid.   Past Medical History  Diagnosis Date  . Complication of anesthesia     post op issue with BP dropping-happened several surgeries- last back surgery was here 2006   . Coronary artery disease     stent RCA 2002  . Sleep apnea     uses nasal cannula only setting is 9 on machine  study done 2 yrs ago at [facility]  . Anginal pain     2002 so that is why cath was done-none since then  . Hypertension   . Heart murmur   . Seasonal allergies   . Urgency of urination   . GERD (gastroesophageal reflux disease)   . Seizures     when his BP dropped too low-3 or 4 times  . Arthritis   . Anemia     hx of bleeding ulcer 1992 and had 5 blood transfusions  . Neuropathy     related to dye in myelogram back in 1968  . Carpal tunnel syndrome, bilateral     Past Surgical History  Procedure Date  . Joint replacement     knee left 2007 at [facility]  . Back surgery     2006 at [facility], 1968 first surgery, 2nd surgery was laminectomy  . Appendectomy   . Tonsillectomy   . Cardiac catheterization     stent RCA 2002 at [facility]     Prior to Admission medications   Medication Sig Start Date End Date Taking? Authorizing Provider  aspirin EC 81 MG tablet Take 81 mg by mouth daily.   Yes Historical Provider, MD  baclofen (LIORESAL) 10 MG tablet Take 5 mg by mouth at bedtime.   Yes Historical Provider, MD  betamethasone dipropionate (DIPROLENE) 0.05 % ointment Apply 1 application topically 2 (two) times daily.  Apply to elbows for psoriasis   Yes Historical Provider, MD  fesoterodine (TOVIAZ) 8 MG TB24 Take 8 mg by mouth at bedtime.   Yes Historical Provider, MD  gabapentin (NEURONTIN) 300 MG capsule Take 300 mg by mouth 3 (three) times daily.   Yes Historical Provider, MD  ibuprofen (ADVIL,MOTRIN) 800 MG tablet Take 800 mg by mouth every 8 (eight) hours.   Yes Historical Provider, MD  lisinopril (PRINIVIL,ZESTRIL) 10 MG tablet Take 10 mg by mouth 2 (two) times daily.   Yes Historical Provider, MD  metoprolol (LOPRESSOR) 50 MG tablet Take 50 mg by mouth 2 (two) times daily.   Yes Historical Provider, MD  Omega-3 Fatty Acids (FISH OIL) 1000 MG CAPS Take 1,000 mg by mouth 2 (two) times daily.   Yes Historical Provider, MD  omeprazole (PRILOSEC) 20 MG capsule Take 20 mg by mouth 2 (two) times daily.   Yes Historical Provider, MD  Probiotic Product (PROBIOTIC DAILY PO) Take 1 capsule by mouth daily.   Yes Historical Provider, MD  terazosin (HYTRIN) 5 MG capsule Take 5 mg by mouth at bedtime.   Yes Historical Provider, MD   No Known Allergies  History  Substance Use Topics  .  Smoking status: Not on file  . Smokeless tobacco: Not on file  . Alcohol Use:     History reviewed. No pertinent family history.   Review of Systems  Positive ROS: Negative  All other systems have been reviewed and were otherwise negative with the exception of those mentioned in the HPI and as above.  Objective: Vital signs in last 24 hours: Temp:  [97.5 F (36.4 C)] 97.5 F (36.4 C) (09/25 1027) Pulse Rate:  [61] 61  (09/25 1027) Resp:  [18] 18  (09/25 1027) BP: (147)/(78) 147/78 mmHg (09/25 1027) SpO2:  [97 %] 97 % (09/25 1027)  General Appearance: Alert, cooperative, no distress, appears stated age Head: Normocephalic, without obvious abnormality, atraumatic Eyes: PERRL Throat: Lips, mucosa, and tongue normal; teeth and gums normal Neck: Supple, Back: Symmetric Heart: Regular rate and rhythm, S1 and S2 normal,  no murmur, rub or gallop Abdomen: Soft, non-tender, bowel sounds active all four quadrants, no masses, no organomegaly Extremities: Extremities normal, atraumatic, no cyanosis or edema Pulses: 2+ and symmetric all extremities Skin: Skin color, texture, turgor normal, no rashes or lesions  NEUROLOGIC:   Mental status: Alert and oriented x4,  no aphasia, good attention span, fund of knowledge, and memory Motor Exam - grossly normal except for mild generalized weakness of the lower extremities. Sensory Exam - grossly normal Reflexes: 1+ Coordination - grossly normal Gait - grossly normal Balance - grossly normal Cranial Nerves: I: smell Not tested  II: visual acuity  OS: nl    OD: nl  II: visual fields Full to confrontation  II: pupils Equal, round, reactive to light  III,VII: ptosis None  III,IV,VI: extraocular muscles  Full ROM  V: mastication Normal  V: facial light touch sensation  Normal  V,VII: corneal reflex  Present  VII: facial muscle function - upper  Normal  VII: facial muscle function - lower Normal  VIII: hearing Not tested  IX: soft palate elevation  Normal  IX,X: gag reflex Present  XI: trapezius strength  5/5  XI: sternocleidomastoid strength 5/5  XI: neck flexion strength  5/5  XII: tongue strength  Normal    Data Review Lab Results  Component Value Date   WBC 6.3 07/29/2012   HGB 12.8* 07/29/2012   HCT 37.7* 07/29/2012   MCV 84.7 07/29/2012   PLT 149* 07/29/2012   Lab Results  Component Value Date   NA 136 07/29/2012   K 4.2 07/29/2012   CL 99 07/29/2012   CO2 27 07/29/2012   BUN 12 07/29/2012   CREATININE 0.64 07/29/2012   GLUCOSE 95 07/29/2012   Lab Results  Component Value Date   INR 1.08 07/29/2012    Assessment/Plan: Patient admitted for PLIF L2-3 with removal of hardware L4-S1. Patient has failed conservative therapy.  I explained the condition and procedure to the patient and answered any questions.  Patient wishes to proceed with procedure as  planned. Understands risks/ benefits and typical outcomes of procedure.   Anyjah Roundtree S 08/05/2012 1:16 PM

## 2012-08-06 ENCOUNTER — Telehealth (HOSPITAL_COMMUNITY): Payer: Self-pay | Admitting: *Deleted

## 2012-08-06 NOTE — Progress Notes (Signed)
UR COMPLETED  

## 2012-08-06 NOTE — Plan of Care (Signed)
Problem: Consults Goal: Diagnosis - Spinal Surgery Thoraco/Lumbar Spine Fusion     

## 2012-08-06 NOTE — Progress Notes (Signed)
Patient ID: Wayne Moore, male   DOB: September 12, 1941, 71 y.o.   MRN: [id number] Subjective: Patient reports he'Moore dong great. No leg pain or weakness. Ambulating in halls.  Objective: Vital signs in last 24 hours: Temp:  [97 F (36.1 C)-98.3 F (36.8 C)] 97.4 F (36.3 C) (09/26 0604) Pulse Rate:  [61-93] 84  (09/26 0604) Resp:  [12-19] 16  (09/26 0604) BP: (90-147)/(47-99) 103/61 mmHg (09/26 0604) SpO2:  [94 %-98 %] 96 % (09/26 0604) FiO2 (%):  [21 %] 21 % (09/25 2302) Weight:  [122.1 kg (269 lb 2.9 oz)-124.966 kg (275 lb 8 oz)] 122.1 kg (269 lb 2.9 oz) (09/25 2038)  Intake/Output from previous day: 09/25 0701 - 09/26 0700 In: 2380 [I.V.:1900; Blood:230; IV Piggyback:250] Out: 2775 [Urine:1775; Drains:400; Blood:600] Intake/Output this shift:    Neurologic: Grossly normal  Lab Results: Lab Results  Component Value Date   WBC 6.3 07/29/2012   HGB 12.8* 07/29/2012   HCT 37.7* 07/29/2012   MCV 84.7 07/29/2012   PLT 149* 07/29/2012   Lab Results  Component Value Date   INR 1.08 07/29/2012   BMET Lab Results  Component Value Date   NA 136 07/29/2012   K 4.2 07/29/2012   CL 99 07/29/2012   CO2 27 07/29/2012   GLUCOSE 95 07/29/2012   BUN 12 07/29/2012   CREATININE 0.64 07/29/2012   CALCIUM 9.3 07/29/2012    Studies/Results: Dg Lumbar Spine 2-3 Views  08/05/2012  *RADIOLOGY REPORT*  Clinical Data: Postop for L2-L3 posterior fixation.  Removal of hardware at L4-L5.  L1-L2 laminectomy.  LUMBAR SPINE - 2-3 VIEW,DG C-ARM 1-60 MIN  Comparison:  Lumbar spine CT of 06/19/2012.  Findings: AP and lateral views.  These demonstrate posterior screw fixation at 2 lumbar levels.  Difficult to localize, secondary to underpenetration and field of view.  No definite acute hardware complication identified.  Interbody fusion material is centrally positioned.  IMPRESSION: Intraoperative imaging of lumbar spine fixation at two levels.   Original Report Authenticated By: Consuello Bossier, M.D.    Dg C-arm  1-60 Min  08/05/2012  *RADIOLOGY REPORT*  Clinical Data: Postop for L2-L3 posterior fixation.  Removal of hardware at L4-L5.  L1-L2 laminectomy.  LUMBAR SPINE - 2-3 VIEW,DG C-ARM 1-60 MIN  Comparison:  Lumbar spine CT of 06/19/2012.  Findings: AP and lateral views.  These demonstrate posterior screw fixation at 2 lumbar levels.  Difficult to localize, secondary to underpenetration and field of view.  No definite acute hardware complication identified.  Interbody fusion material is centrally positioned.  IMPRESSION: Intraoperative imaging of lumbar spine fixation at two levels.   Original Report Authenticated By: Consuello Bossier, M.D.     Assessment/Plan: Doing really well. PT/OT.   LOS: 1 day    Wayne Moore 08/06/2012, 8:41 AM

## 2012-08-06 NOTE — Evaluation (Signed)
Physical Therapy Evaluation Patient Details Name: Wayne Moore MRN: [id number] DOB: 04-10-1941 Today's Date: 08/06/2012 Time: 4098-1191 PT Time Calculation (min): 28 min  PT Assessment / Plan / Recommendation Clinical Impression  Pt s/p PLF L2-3. Pt with 3 previous back surgeries and was able to recall and maintain back precautions with minimal cueing. Pt will benefit from skilled PT in the acute care setting in order to increased strength and functional mobility for a safe d/c home with family.    PT Assessment  Patient needs continued PT services    Follow Up Recommendations  Home health PT;Supervision for mobility/OOB    Barriers to Discharge        Equipment Recommendations  None recommended by PT    Recommendations for Other Services     Frequency Min 5X/week    Precautions / Restrictions Precautions Precautions: Back Precaution Booklet Issued: Yes (comment) Precaution Comments: pt educated on 3/3 back precautions Required Braces or Orthoses: Spinal Brace Spinal Brace: Lumbar corset;Applied in sitting position Restrictions Weight Bearing Restrictions: No   Pertinent Vitals/Pain Pt with 2-3/10 pain complaints.       Mobility  Bed Mobility Bed Mobility: Rolling Right;Right Sidelying to Sit;Sitting - Scoot to Edge of Bed Rolling Right: 4: Min assist Right Sidelying to Sit: 4: Min assist Sitting - Scoot to Edge of Bed: 4: Min guard Details for Bed Mobility Assistance: VC for proper sequencing to maintain back precautions Transfers Transfers: Sit to Stand;Stand to Sit Sit to Stand: 4: Min guard;With upper extremity assist;From bed Stand to Sit: 4: Min guard;With upper extremity assist;To chair/3-in-1 Details for Transfer Assistance: VC for proper sequencing to maintain back precautions and avoid bending while standing. pt with good control on ascent and descent Ambulation/Gait Ambulation/Gait Assistance: 4: Min guard Ambulation Distance (Feet): 150  Feet Assistive device: Rolling walker Ambulation/Gait Assistance Details: VC for safe distanace to RW as well as cues to maintain upright posture throughout ambulation. Pt with bilateral foot drop, although no dragging and pt able to compensate for a safe ambulation. Increased support on UE.  Gait Pattern: Step-to pattern;Decreased dorsiflexion - left;Decreased dorsiflexion - right;Decreased stride length;Trunk flexed Gait velocity: decreased gait speed    Shoulder Instructions     Exercises     PT Diagnosis: Difficulty walking;Acute pain  PT Problem List: Decreased activity tolerance;Decreased strength;Decreased mobility;Decreased knowledge of use of DME;Decreased safety awareness;Decreased knowledge of precautions;Pain PT Treatment Interventions: Functional mobility training;DME instruction;Gait training;Stair training;Therapeutic activities;Patient/family education   PT Goals Acute Rehab PT Goals PT Goal Formulation: With patient Time For Goal Achievement: 08/13/12 Potential to Achieve Goals: Good Pt will Roll Supine to Right Side: with modified independence PT Goal: Rolling Supine to Right Side - Progress: Goal set today Pt will Roll Supine to Left Side: with modified independence PT Goal: Rolling Supine to Left Side - Progress: Goal set today Pt will go Supine/Side to Sit: with modified independence PT Goal: Supine/Side to Sit - Progress: Goal set today Pt will go Sit to Supine/Side: with modified independence PT Goal: Sit to Supine/Side - Progress: Goal set today Pt will go Sit to Stand: with modified independence PT Goal: Sit to Stand - Progress: Goal set today Pt will go Stand to Sit: with modified independence PT Goal: Stand to Sit - Progress: Goal set today Pt will Transfer Bed to Chair/Chair to Bed: with modified independence PT Transfer Goal: Bed to Chair/Chair to Bed - Progress: Goal set today Pt will Ambulate: >150 feet;with modified independence;with least restrictive  assistive  device PT Goal: Ambulate - Progress: Goal set today Pt will Go Up / Down Stairs: 1-2 stairs;with modified independence;with rail(s) PT Goal: Up/Down Stairs - Progress: Goal set today  Visit Information  Last PT Received On: 08/06/12 Assistance Needed: +1    Subjective Data  Subjective: I feel good Patient Stated Goal: to be able to get back to getting up and around by himself and travel   Prior Functioning  Home Living Lives With: Son Available Help at Discharge: Family;Available 24 hours/day Type of Home: House Home Access: Stairs to enter [employer] of Steps: 1 Entrance Stairs-Rails: None Home Layout: Two level;Able to live on main level with bedroom/bathroom Alternate Level Stairs-Number of Steps: 13 Alternate Level Stairs-Rails: Right Bathroom Shower/Tub: Tub/shower unit;Curtain Bathroom Toilet: Standard Bathroom Accessibility: Yes How Accessible: Accessible via walker Home Adaptive Equipment: Grab bars in shower;Shower chair with back;Shower chair without back;Bedside commode/3-in-1;Walker - rolling;Other (comment);Reacher;Sock aid;Long-handled sponge;Straight cane (rollator) Prior Function Level of Independence: Independent (uses cane at all times) Able to Take Stairs?: Yes Driving: Yes Vocation: Retired Comments: working around [employer] Communication: No difficulties Dominant Hand: Right    Cognition  Overall Cognitive Status: Appears within functional limits for tasks assessed/performed Arousal/Alertness: Awake/alert Orientation Level: Appears intact for tasks assessed Behavior During Session: [facility] for tasks performed    Extremity/Trunk Assessment Right Lower Extremity Assessment RLE ROM/Strength/Tone: Within functional levels RLE Sensation: History of peripheral neuropathy Left Lower Extremity Assessment LLE ROM/Strength/Tone: Within functional levels LLE Sensation: History of peripheral neuropathy   Balance    End of  Session PT - End of Session Equipment Utilized During Treatment: Gait belt;Back brace Activity Tolerance: Patient tolerated treatment well Patient left: in chair;with call bell/phone within reach;with nursing in room Nurse Communication: Mobility status     Milana Kidney 08/06/2012, 9:41 AM  08/06/2012 Milana Kidney DPT PAGER: [phone] OFFICE: [phone]

## 2012-08-06 NOTE — Progress Notes (Signed)
Occupational Therapy Evaluation Patient Details Name: Wayne Moore MRN: [id number] DOB: 06/03/1941 Today's Date: 08/06/2012 Time: 1740-1756 OT Time Calculation (min): 16 min  OT Assessment / Plan / Recommendation Clinical Impression  71 yo s/p PLIF. Hx of back surgery. Pt able to recall 3.3 back precautions. Pt has nec AE and DME at [facility] and supportive family who can provide 24/7 assist as needed. Pt ready for D/C from OT standpoint for ADL. All education complete.     OT Assessment  Patient does not need any further OT services    Follow Up Recommendations  No OT follow up    Barriers to Discharge  none    Equipment Recommendations  None recommended by OT    Recommendations for Other Services  none  Frequency    eval only   Precautions / Restrictions Precautions Precautions: Back Precaution Booklet Issued: Yes (comment) Precaution Comments: pt educated on 3/3 back precautions Required Braces or Orthoses: Spinal Brace Spinal Brace: Lumbar corset;Applied in sitting position Restrictions Weight Bearing Restrictions: No   Pertinent Vitals/Pain none    ADL  Grooming: Simulated;Modified independent Where Assessed - Grooming: Unsupported standing Upper Body Bathing: Simulated;Set up Where Assessed - Upper Body Bathing: Unsupported sitting Lower Body Bathing: Simulated;Minimal assistance;Other (comment) (with AE) Where Assessed - Lower Body Bathing: Unsupported sit to stand Upper Body Dressing: Simulated;Set up Where Assessed - Upper Body Dressing: Unsupported sitting Lower Body Dressing: Simulated;Minimal assistance Where Assessed - Lower Body Dressing: Unsupported sit to stand Toilet Transfer: Simulated;Supervision/safety Toilet Transfer Method: Stand pivot Equipment Used: Gait belt;Back brace Transfers/Ambulation Related to ADLs: S ADL Comments: Pt able to verbalize and demonstrate use of AE for ADL. Reinforced improtance of following back precautions.     OT  Diagnosis:    OT Problem List:   OT Treatment Interventions:     OT Goals Acute Rehab OT Goals OT Goal Formulation:  (eval only)  Visit Information  Last OT Received On: 08/06/12 Assistance Needed: +1    Subjective Data   I will have plenty of help   Prior Functioning     Home Living Lives With: Son Available Help at Discharge: Family;Available 24 hours/day Type of Home: House Home Access: Stairs to enter [employer] of Steps: 1 Entrance Stairs-Rails: None Home Layout: Two level;Able to live on main level with bedroom/bathroom Alternate Level Stairs-Number of Steps: 13 Alternate Level Stairs-Rails: Right Bathroom Shower/Tub: Tub/shower unit;Curtain Bathroom Toilet: Standard Bathroom Accessibility: Yes How Accessible: Accessible via walker Home Adaptive Equipment: Grab bars in shower;Shower chair with back;Shower chair without back;Bedside commode/3-in-1;Walker - rolling;Other (comment);Reacher;Sock aid;Long-handled sponge;Straight cane (rollator) Additional Comments: daughter will stay during the day while son is at work Prior Function Level of Independence: Independent (uses cane at all times) Able to Take Stairs?: Yes Driving: Yes Vocation: Retired Musician: No difficulties Dominant Hand: Right         Vision/Perception     Cognition  Overall Cognitive Status: Appears within functional limits for tasks assessed/performed Arousal/Alertness: Awake/alert Orientation Level: Appears intact for tasks assessed Behavior During Session: [facility] for tasks performed    Extremity/Trunk Assessment Right Upper Extremity Assessment RUE ROM/Strength/Tone: [facility] for tasks assessed RUE Sensation: History of peripheral neuropathy Left Upper Extremity Assessment LUE ROM/Strength/Tone: WFL for tasks assessed LUE Sensation: History of peripheral neuropathy Right Lower Extremity Assessment RLE ROM/Strength/Tone: Within functional levels RLE  Sensation: History of peripheral neuropathy Left Lower Extremity Assessment LLE ROM/Strength/Tone: Within functional levels LLE Sensation: History of peripheral neuropathy     Mobility  s to Mod i              Balance  WFL   End of Session OT - End of Session Equipment Utilized During Treatment: Gait belt Activity Tolerance: Patient tolerated treatment well Patient left: in chair;with call bell/phone within reach;with nursing in room Nurse Communication: Mobility status;Precautions  GO     Ely Spragg,HILLARY 08/06/2012, 5:59 PM [facility], OTR/L  [phone] 08/06/2012

## 2012-08-07 NOTE — Progress Notes (Signed)
Bladder scan completed,  Scan shows no residual.

## 2012-08-07 NOTE — Progress Notes (Signed)
Placed patient on cpap 9cmh20. Via home machine. Tolerating well. Stas 98 hr 76 16rr. Machine checks out good,.

## 2012-08-07 NOTE — Progress Notes (Signed)
Patient ID: Wayne Moore, male   DOB: Sep 07, 1941, 71 y.o.   MRN: [id number] Pt doing well, but feels he needs one more day. Some urinary retention. Will check bladder scan. No leg pain., Amb well. Likely home tomorrow.

## 2012-08-07 NOTE — Progress Notes (Signed)
Patient ambulated around the unit x 1 with front wheel walker, tolerated well.  Rolland Porter [id number]-SN2

## 2012-08-07 NOTE — Progress Notes (Signed)
Physical Therapy Treatment Patient Details Name: DEITRICK CAPRA MRN: [id number] DOB: [birth year] Today's Date: 08/07/2012 Time: 4098-1191 PT Time Calculation (min): 18 min  PT Assessment / Plan / Recommendation Comments on Treatment Session  Pt progressing well. Pt is near baselien functional level. Continue per plan prior to d/c for a safe d/c home with family    Follow Up Recommendations  Home health PT;Supervision for mobility/OOB    Barriers to Discharge        Equipment Recommendations  None recommended by OT    Recommendations for Other Services    Frequency Min 5X/week   Plan Discharge plan remains appropriate;Frequency remains appropriate    Precautions / Restrictions Precautions Precautions: Back Precaution Comments: pt able to verbalize and maintain 3/3 back precautions Required Braces or Orthoses: Spinal Brace Spinal Brace: Lumbar corset;Applied in sitting position Restrictions Weight Bearing Restrictions: No   Pertinent Vitals/Pain Pt with no pain complaints.     Mobility  Bed Mobility Bed Mobility: Not assessed Transfers Transfers: Sit to Stand;Stand to Sit Sit to Stand: 5: Supervision Stand to Sit: 5: Supervision Details for Transfer Assistance: Cues for hand placement for safety.  Ambulation/Gait Ambulation/Gait Assistance: 5: Supervision Ambulation Distance (Feet): 300 Feet Assistive device: Rolling walker Ambulation/Gait Assistance Details: VC for closer placement of body to RW for increased stability and better posture during ambulation.  Gait Pattern: Step-to pattern;Decreased dorsiflexion - left;Decreased dorsiflexion - right;Decreased stride length;Trunk flexed Gait velocity: decreased gait speed Stairs: Yes Stairs Assistance: 5: Supervision Stairs Assistance Details (indicate cue type and reason): VC for proper sequencing Stair Management Technique: Two rails;Forwards;Step to pattern Number of Stairs: 3     Exercises     PT Diagnosis:      PT Problem List:   PT Treatment Interventions:     PT Goals Acute Rehab PT Goals PT Goal: Sit to Stand - Progress: Progressing toward goal PT Goal: Stand to Sit - Progress: Progressing toward goal PT Transfer Goal: Bed to Chair/Chair to Bed - Progress: Progressing toward goal PT Goal: Ambulate - Progress: Progressing toward goal PT Goal: Up/Down Stairs - Progress: Progressing toward goal  Visit Information  Last PT Received On: 08/07/12 Assistance Needed: +1    Subjective Data      Cognition  Overall Cognitive Status: Appears within functional limits for tasks assessed/performed Arousal/Alertness: Awake/alert Orientation Level: Appears intact for tasks assessed Behavior During Session: [facility] for tasks performed    Balance     End of Session PT - End of Session Equipment Utilized During Treatment: Gait belt;Back brace Activity Tolerance: Patient tolerated treatment well Patient left: in chair;with call bell/phone within reach;with nursing in room Nurse Communication: Mobility status   GP     Milana Kidney 08/07/2012, 1:04 PM

## 2012-08-08 MED ORDER — OXYCODONE HCL 5 MG PO TABS: 5.0000 mg | ORAL_TABLET | Freq: Four times a day (QID) | ORAL | Status: AC | PRN

## 2012-08-08 NOTE — Discharge Summary (Signed)
Physician Discharge Summary  Patient ID: Wayne Moore MRN: [id number] DOB/AGE: 1941/03/18 71 y.o.  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 *    Discharged Condition: good  Hospital Course: Wayne Moore was admitted on the 25th and underwent a lumbar fusion at L2/3. Postop he has done well. Wound is clean, dry, and without signs of infection. He is walking, tolerating a regular diet. Strength is normal in the lower extremities.  Consults: None  Significant Diagnostic Studies: none  Treatments: surgery: 1. Decompressive lumbar laminectomy L1-2 and L2-3 requiring more work than would be required of the typical PLIF procedure in order to adequately decompress the neural elements to address his spinal stenosis.  2. Posterior lumbar interbody fusion L2-3 using a PEEK interbody cage packed with morcellized allograft and autograft and an allograft wedge.  3. Posterior fixation L2-3 using Legacy pedicle screws.  4. Intertransverse arthrodesis L2-3 using morcellized autograft and allograft.   Discharge Exam: Blood pressure 137/87, pulse 77, temperature 98.2 F (36.8 C), temperature source Oral, resp. rate 18, height 5\' 11"  (1.803 m), weight 122.1 kg (269 lb 2.9 oz), SpO2 100.00%. General appearance: alert, cooperative and appears stated age Neurologic: Mental status: Alert, oriented, thought content appropriate Cranial nerves: normal Motor: moving lower extremities well.  Disposition:      Medication List     As of 08/08/2012  9:42 AM    STOP taking these medications         ibuprofen 800 MG tablet   Commonly known as: ADVIL,MOTRIN      PROBIOTIC DAILY PO      TAKE these medications         aspirin EC 81 MG tablet   Take 81 mg by mouth daily.      baclofen 10 MG tablet   Commonly known as: LIORESAL   Take 5 mg by  mouth at bedtime.      betamethasone dipropionate 0.05 % ointment   Commonly known as: DIPROLENE   Apply 1 application topically 2 (two) times daily. Apply to elbows for psoriasis      fesoterodine 8 MG Tb24   Commonly known as: TOVIAZ   Take 8 mg by mouth at bedtime.      Fish Oil 1000 MG Caps   Take 1,000 mg by mouth 2 (two) times daily.      gabapentin 300 MG capsule   Commonly known as: NEURONTIN   Take 300 mg by mouth 3 (three) times daily.      lisinopril 10 MG tablet   Commonly known as: PRINIVIL,ZESTRIL   Take 10 mg by mouth 2 (two) times daily.      metoprolol 50 MG tablet   Commonly known as: LOPRESSOR   Take 50 mg by mouth 2 (two) times daily.      omeprazole 20 MG capsule   Commonly known as: PRILOSEC   Take 20 mg by mouth 2 (two) times daily.      oxyCODONE 5 MG immediate release tablet   Commonly known as: Oxy IR/ROXICODONE   Take 1 tablet (5 mg total) by mouth every 6 (six) hours as needed for pain.      terazosin 5 MG capsule   Commonly known as: HYTRIN   Take 5 mg by mouth at bedtime.           Follow-up Information    Follow up with JONES,DAVID  S, MD. Schedule an appointment as soon as possible for a visit in 2 weeks.   Contact information:   [address] [place] [address] [phone]          Signed: Braeson Rupe L 08/08/2012, 9:42 AM

## 2012-08-10 MED FILL — Sodium Chloride IV Soln 0.9%: INTRAVENOUS | Qty: 2000 | Status: AC

## 2012-08-10 MED FILL — Heparin Sodium (Porcine) Inj 1000 Unit/ML: INTRAMUSCULAR | Qty: 30 | Status: AC

## 2012-08-10 NOTE — Care Management Note (Signed)
    Page 1 of 1   08/10/2012     10:17:25 AM   CARE MANAGEMENT NOTE 08/10/2012  Patient:  YORK, VALLIANT   Account Number:  [id number]  Date Initiated:  08/10/2012  Documentation initiated by:  Onnie Boer  Subjective/Objective Assessment:   PT WAS ADMITTED FOR BACK SURGERY     Action/Plan:   PROGRESSION OF CARE AND DISCHARGE PLANNING   Anticipated DC Date:  08/08/2012   Anticipated DC Plan:  HOME W HOME HEALTH SERVICES      DC Planning Services  CM consult      Choice offered to / List presented to:  C-1 Patient        HH arranged  HH-2 PT      HH agency  [facility]   Status of service:  Completed, signed off Medicare Important Message given?   (If response is "NO", the following Medicare IM given date fields will be blank) Date Medicare IM given:   Date Additional Medicare IM given:    Discharge Disposition:  HOME W HOME HEALTH SERVICES  Per UR Regulation:  Reviewed for med. necessity/level of care/duration of stay  If discussed at Long Length of Stay Meetings, dates discussed:    Comments:

## 2014-09-08 ENCOUNTER — Other Ambulatory Visit: Payer: Self-pay | Admitting: Neurological Surgery

## 2014-09-08 DIAGNOSIS — M545 Low back pain: Secondary | ICD-10-CM

## 2014-09-16 ENCOUNTER — Ambulatory Visit
Admission: RE | Admit: 2014-09-16 | Discharge: 2014-09-16 | Disposition: A | Discharge status: Home / Self Care | Payer: Medicare Other | Source: Ambulatory Visit | Attending: Neurological Surgery | Admitting: Neurological Surgery

## 2014-09-16 DIAGNOSIS — M545 Low back pain: Secondary | ICD-10-CM

## 2016-09-24 IMAGING — MR MR LUMBAR SPINE W/O CM
4 of 5 series · 24 of 48 positions shown · non-contrast
Comparison: None.

CLINICAL DATA: Back pain and bilateral leg pain beginning 5 months
ago after the patient fell, missing a step and falling directly onto
the buttocks. Two prior lumbar fusions, last in 5555.

EXAM:
MRI LUMBAR SPINE WITHOUT CONTRAST
TECHNIQUE: Multiplanar, multisequence MR imaging of the lumbar spine was
performed. No intravenous contrast was administered.

[Series 3: T2 · sagittal · 4.0mm · 0.55mm/px · 6 of 13 slices shown (1 of 2)]
[im 1/13]
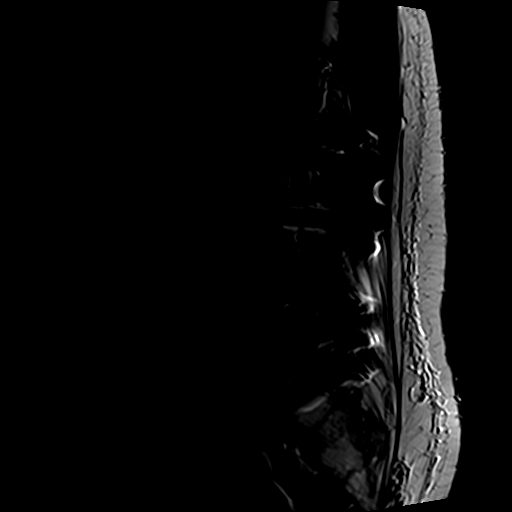
[im 3/13]
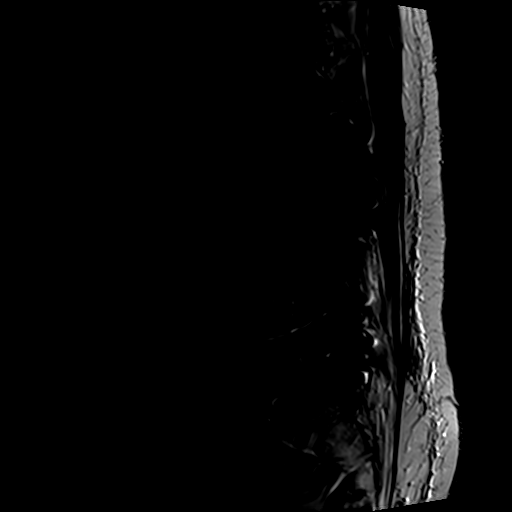
[im 5/13]
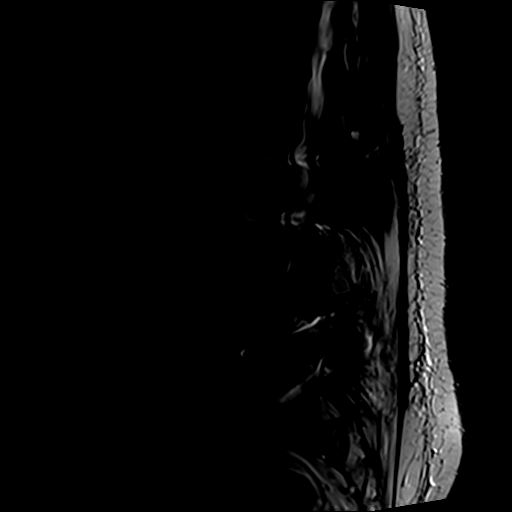
[im 8/13]
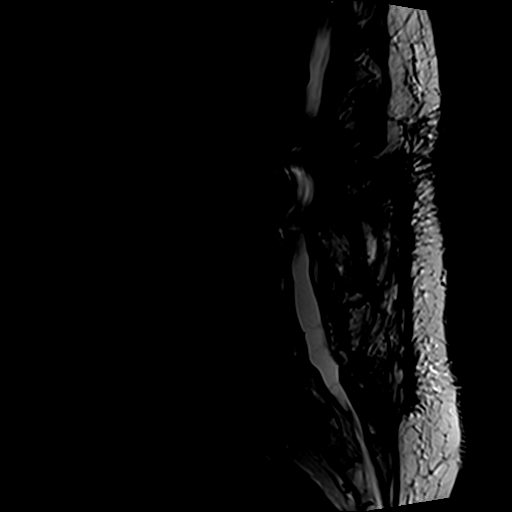
[im 10/13]
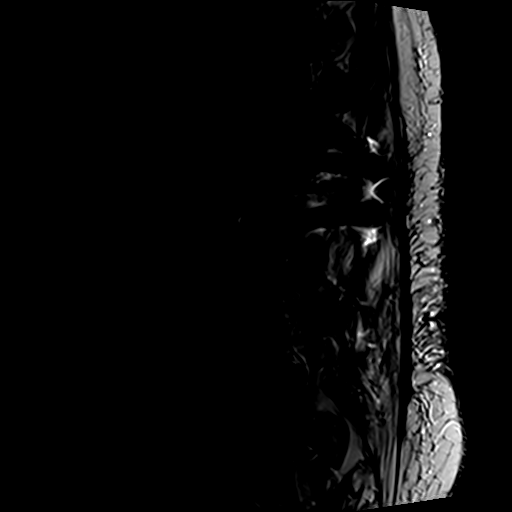
[im 13/13]
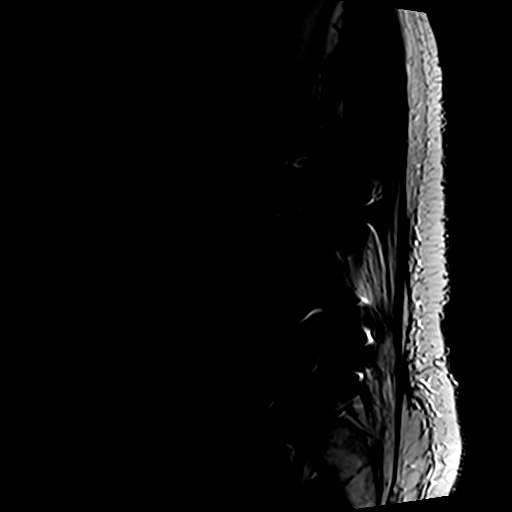

[Series 4: T1 · sagittal · 4.0mm · 0.55mm/px · 6 of 13 slices shown (1 of 2)]
[im 1/13]
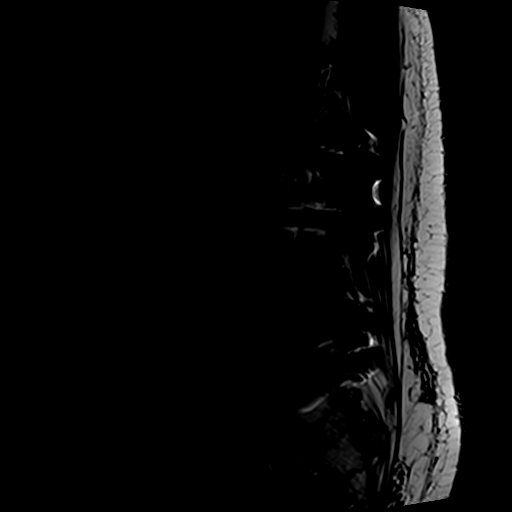
[im 3/13]
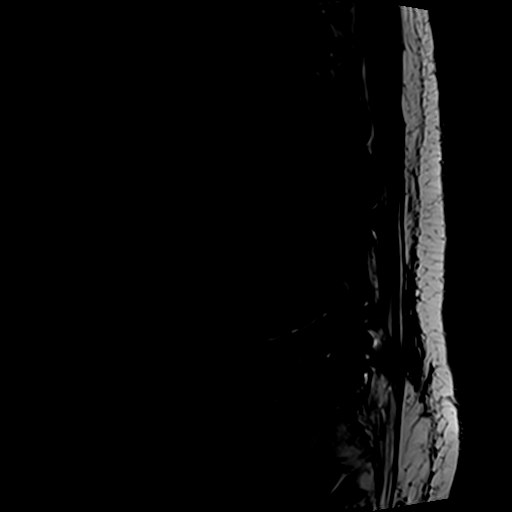
[im 5/13]
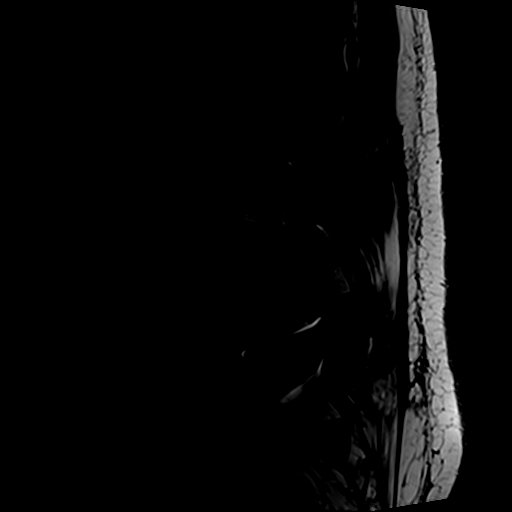
[im 8/13]
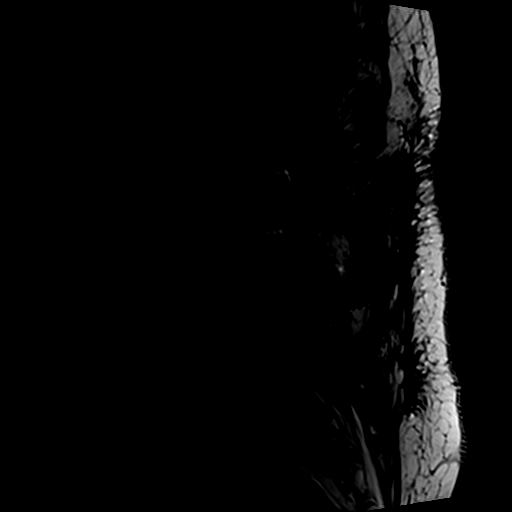
[im 10/13]
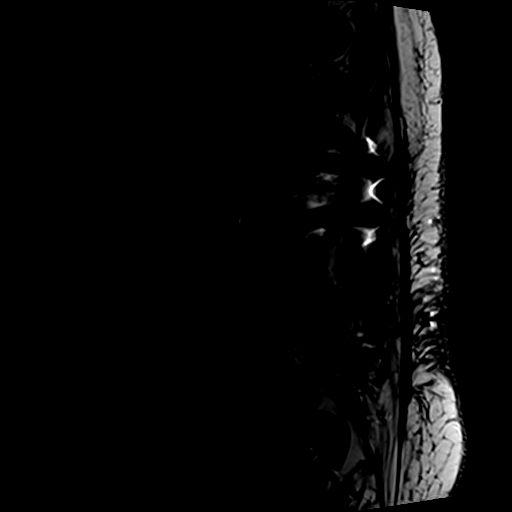
[im 13/13]
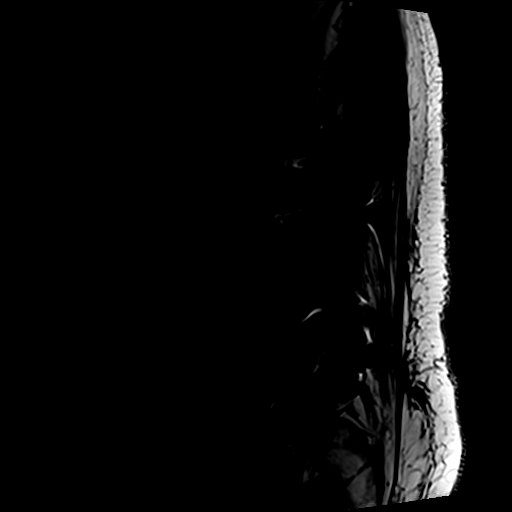

[Series 6: T2 · axial · 4.0mm · 0.78mm/px · z∈[-25,+153]mm · 9 of 34 slices shown (2 of 2)]
[im 1/34]
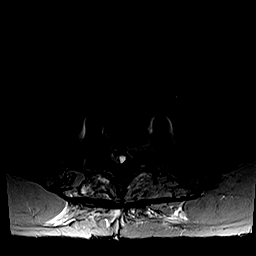
[im 5/34]
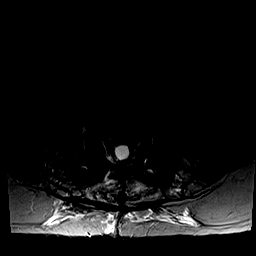
[im 10/34]
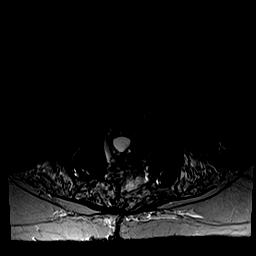
[im 15/34]
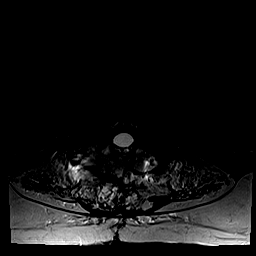
[im 17/34]
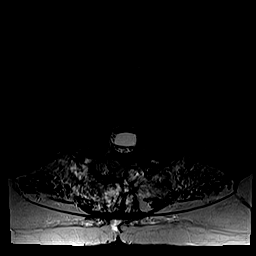
[im 19/34]
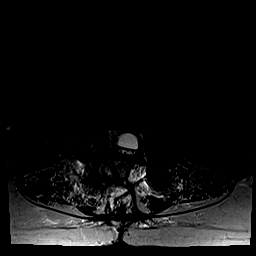
[im 24/34]
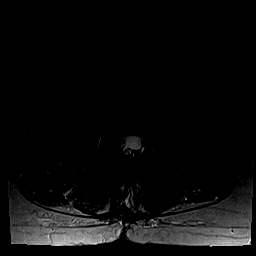
[im 29/34]
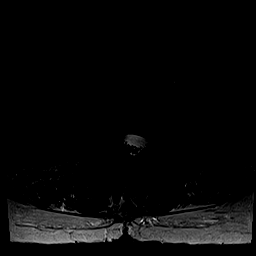
[im 34/34]
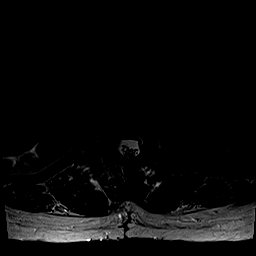

[Series 7: T1 · axial · 4.0mm · 0.39mm/px · z∈[-6,+128]mm · 3 of 34 slices shown (2 of 2)]
[im 5/34]
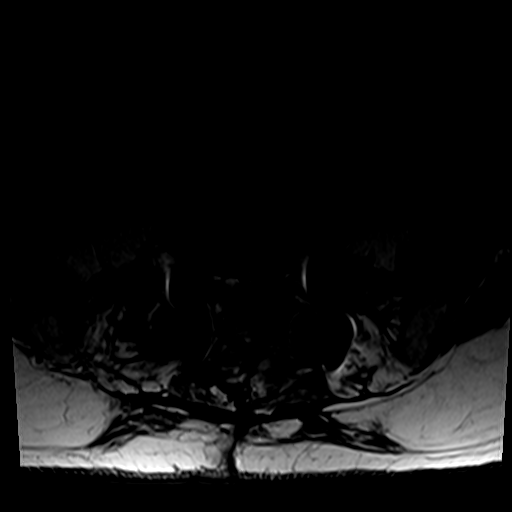
[im 17/34]
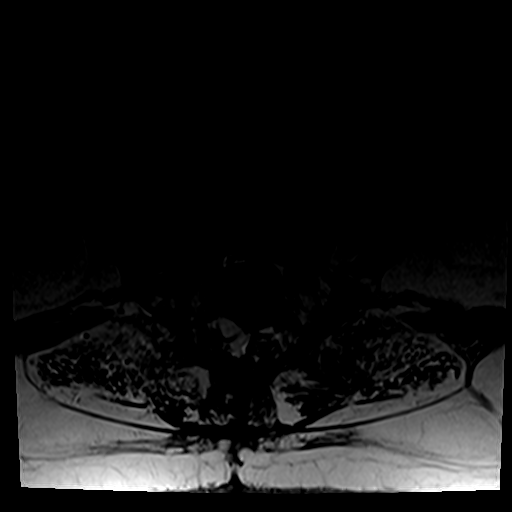
[im 29/34]
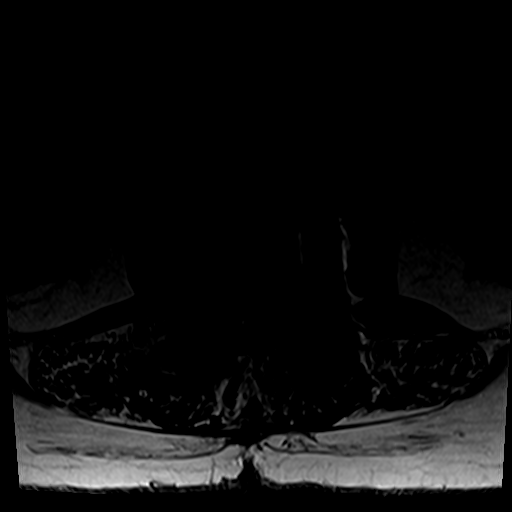

[24 of 48 positions shown; findings below may reference images not displayed]

FINDINGS: For the purposes of this dictation, the lowest well-formed
intervertebral disc space is presumed to be L5-S1.

There is mild lumbar levoscoliosis. There is slight retrolisthesis
of L1 on L2 and L2 on L3. Sequelae of prior posterior fusion are
identified at L2-3 and L5-S1 with bilateral pedicle screws at each
level. Old pedicle screw tracks are noted bilaterally at L4.
Interbody fusion devices are present at L2-3 and L4-5. There is
fusion across the L5-S1 disc space. There is mild-to-moderate disc
space narrowing at L1-2 with mild discogenic marrow edema noted.
Vertebral body heights are preserved without compression fracture.
Conus medullaris is normal in signal and terminates at T12-L1. The
nerve roots of the cauda equina demonstrate areas of mild thickening
and are peripherally displaced into the posterior and lateral
aspects of the spinal canal from L3 to the sacrum, consistent with
arachnoiditis.

T12-L1: Only imaged sagittally. Mild disc bulging without evidence
of stenosis.

L1-2: Listhesis with disc uncovering, disc bulging, and mild facet
and ligamentum flavum hypertrophy result in mild spinal stenosis and
mild to moderate right greater than left neural foraminal stenosis.

L2-3: Prior posterior decompression and fusion. Spinal canal is
widely patent. Endplate spurring results in minimal right neural
foraminal narrowing.

L3-4: Prior posterior decompression. Spinal canal is widely patent.
Mild endplate spurring without definite neural foraminal stenosis.

L4-5: Prior posterior decompression and fusion. Spinal canal is
widely patent. No definite neural foraminal stenosis.

L5-S1: Prior posterior decompression and fusion. Spinal canal is
widely patent. Endplate spurring with at most mild left greater than
right neural foraminal narrowing.
IMPRESSION: 1. Postoperative changes from L2-S1 as above without spinal
stenosis.
2. Adjacent segment disease at L1-2 resulting in mild spinal
stenosis and mild to moderate neural foraminal stenosis.
3. Arachnoiditis.
